# Patient Record
Sex: Female | Born: 1935 | Race: White | Hispanic: No | Marital: Married | Smoking: Never smoker
Health system: Southern US, Community
[De-identification: ages and names within clinical notes are randomized; demographics above are authoritative.]

## PROBLEM LIST (undated history)

## (undated) DIAGNOSIS — M810 Age-related osteoporosis without current pathological fracture: Secondary | ICD-10-CM

## (undated) DIAGNOSIS — N6019 Diffuse cystic mastopathy of unspecified breast: Secondary | ICD-10-CM

## (undated) DIAGNOSIS — H547 Unspecified visual loss: Secondary | ICD-10-CM

## (undated) DIAGNOSIS — Z803 Family history of malignant neoplasm of breast: Secondary | ICD-10-CM

## (undated) DIAGNOSIS — M1990 Unspecified osteoarthritis, unspecified site: Secondary | ICD-10-CM

## (undated) DIAGNOSIS — R0600 Dyspnea, unspecified: Secondary | ICD-10-CM

## (undated) DIAGNOSIS — Z1211 Encounter for screening for malignant neoplasm of colon: Secondary | ICD-10-CM

## (undated) DIAGNOSIS — I4891 Unspecified atrial fibrillation: Secondary | ICD-10-CM

## (undated) DIAGNOSIS — I509 Heart failure, unspecified: Secondary | ICD-10-CM

## (undated) DIAGNOSIS — I471 Supraventricular tachycardia, unspecified: Secondary | ICD-10-CM

## (undated) DIAGNOSIS — I10 Essential (primary) hypertension: Secondary | ICD-10-CM

## (undated) DIAGNOSIS — I219 Acute myocardial infarction, unspecified: Secondary | ICD-10-CM

## (undated) DIAGNOSIS — I517 Cardiomegaly: Secondary | ICD-10-CM

## (undated) DIAGNOSIS — IMO0002 Reserved for concepts with insufficient information to code with codable children: Secondary | ICD-10-CM

## (undated) DIAGNOSIS — E119 Type 2 diabetes mellitus without complications: Secondary | ICD-10-CM

## (undated) DIAGNOSIS — Z9889 Other specified postprocedural states: Secondary | ICD-10-CM

## (undated) DIAGNOSIS — E785 Hyperlipidemia, unspecified: Secondary | ICD-10-CM

## (undated) DIAGNOSIS — I2510 Atherosclerotic heart disease of native coronary artery without angina pectoris: Secondary | ICD-10-CM

## (undated) DIAGNOSIS — E079 Disorder of thyroid, unspecified: Secondary | ICD-10-CM

## (undated) HISTORY — DX: Type 2 diabetes mellitus without complications: E11.9

## (undated) HISTORY — DX: Encounter for screening for malignant neoplasm of colon: Z12.11

## (undated) HISTORY — DX: Heart failure, unspecified: I50.9

## (undated) HISTORY — DX: Family history of malignant neoplasm of breast: Z80.3

## (undated) HISTORY — DX: Dyspnea, unspecified: R06.00

## (undated) HISTORY — DX: Essential (primary) hypertension: I10

## (undated) HISTORY — DX: Disorder of thyroid, unspecified: E07.9

## (undated) HISTORY — DX: Unspecified visual loss: H54.7

## (undated) HISTORY — PX: BREAST SURGERY: SHX581

## (undated) HISTORY — DX: Atherosclerotic heart disease of native coronary artery without angina pectoris: I25.10

## (undated) HISTORY — DX: Diffuse cystic mastopathy of unspecified breast: N60.19

## (undated) HISTORY — DX: Acute myocardial infarction, unspecified: I21.9

## (undated) HISTORY — DX: Cardiomegaly: I51.7

## (undated) HISTORY — DX: Unspecified atrial fibrillation: I48.91

## (undated) HISTORY — DX: Supraventricular tachycardia, unspecified: I47.10

## (undated) HISTORY — PX: APPENDECTOMY: SHX54

## (undated) HISTORY — DX: Unspecified osteoarthritis, unspecified site: M19.90

## (undated) HISTORY — DX: Hyperlipidemia, unspecified: E78.5

## (undated) HISTORY — DX: Other specified postprocedural states: Z98.890

## (undated) HISTORY — PX: ABDOMINAL HYSTERECTOMY: SHX81

## (undated) HISTORY — DX: Reserved for concepts with insufficient information to code with codable children: IMO0002

## (undated) HISTORY — DX: Age-related osteoporosis without current pathological fracture: M81.0

## (undated) HISTORY — DX: Supraventricular tachycardia: I47.1

---

## 2003-03-08 DIAGNOSIS — H547 Unspecified visual loss: Secondary | ICD-10-CM

## 2003-03-08 DIAGNOSIS — I10 Essential (primary) hypertension: Secondary | ICD-10-CM

## 2003-03-08 DIAGNOSIS — IMO0002 Reserved for concepts with insufficient information to code with codable children: Secondary | ICD-10-CM

## 2003-03-08 HISTORY — DX: Essential (primary) hypertension: I10

## 2003-03-08 HISTORY — DX: Unspecified visual loss: H54.7

## 2003-03-08 HISTORY — DX: Reserved for concepts with insufficient information to code with codable children: IMO0002

## 2004-02-25 ENCOUNTER — Emergency Department: Payer: Self-pay | Admitting: Emergency Medicine

## 2004-03-07 DIAGNOSIS — I219 Acute myocardial infarction, unspecified: Secondary | ICD-10-CM

## 2004-03-07 HISTORY — DX: Acute myocardial infarction, unspecified: I21.9

## 2004-03-07 HISTORY — PX: CORONARY ARTERY BYPASS GRAFT: SHX141

## 2004-04-15 ENCOUNTER — Ambulatory Visit: Payer: Self-pay | Admitting: General Surgery

## 2004-09-14 ENCOUNTER — Encounter: Payer: Self-pay | Admitting: Cardiovascular Disease

## 2004-10-05 ENCOUNTER — Encounter: Payer: Self-pay | Admitting: Cardiovascular Disease

## 2004-11-05 ENCOUNTER — Encounter: Payer: Self-pay | Admitting: Cardiovascular Disease

## 2004-12-05 ENCOUNTER — Encounter: Payer: Self-pay | Admitting: Cardiovascular Disease

## 2005-01-05 ENCOUNTER — Encounter: Payer: Self-pay | Admitting: Cardiovascular Disease

## 2005-03-07 DIAGNOSIS — E119 Type 2 diabetes mellitus without complications: Secondary | ICD-10-CM

## 2005-03-07 HISTORY — DX: Type 2 diabetes mellitus without complications: E11.9

## 2005-06-29 ENCOUNTER — Ambulatory Visit: Payer: Self-pay | Admitting: General Surgery

## 2006-07-11 ENCOUNTER — Ambulatory Visit: Payer: Self-pay | Admitting: General Surgery

## 2007-03-08 DIAGNOSIS — Z9889 Other specified postprocedural states: Secondary | ICD-10-CM

## 2007-03-08 DIAGNOSIS — I517 Cardiomegaly: Secondary | ICD-10-CM

## 2007-03-08 HISTORY — DX: Cardiomegaly: I51.7

## 2007-03-08 HISTORY — DX: Other specified postprocedural states: Z98.890

## 2007-11-06 ENCOUNTER — Ambulatory Visit: Payer: Self-pay | Admitting: General Surgery

## 2008-03-07 HISTORY — PX: COLONOSCOPY: SHX174

## 2008-10-26 ENCOUNTER — Ambulatory Visit: Payer: Self-pay | Admitting: Internal Medicine

## 2008-11-07 ENCOUNTER — Ambulatory Visit: Payer: Self-pay | Admitting: General Surgery

## 2009-03-07 DIAGNOSIS — E079 Disorder of thyroid, unspecified: Secondary | ICD-10-CM

## 2009-03-07 HISTORY — DX: Disorder of thyroid, unspecified: E07.9

## 2009-12-09 ENCOUNTER — Ambulatory Visit: Payer: Self-pay | Admitting: General Surgery

## 2010-12-13 ENCOUNTER — Ambulatory Visit: Payer: Self-pay | Admitting: General Surgery

## 2011-03-08 DIAGNOSIS — M810 Age-related osteoporosis without current pathological fracture: Secondary | ICD-10-CM

## 2011-03-08 HISTORY — DX: Age-related osteoporosis without current pathological fracture: M81.0

## 2011-06-14 ENCOUNTER — Ambulatory Visit: Payer: Self-pay | Admitting: Internal Medicine

## 2011-06-14 LAB — URINALYSIS, COMPLETE
Bilirubin,UR: NEGATIVE
Blood: NEGATIVE
Glucose,UR: NEGATIVE mg/dL (ref 0–75)
Ketone: NEGATIVE
Nitrite: NEGATIVE
Ph: 6 (ref 4.5–8.0)
RBC,UR: NONE SEEN /HPF (ref 0–5)

## 2011-06-16 LAB — URINE CULTURE

## 2011-08-12 ENCOUNTER — Emergency Department: Payer: Self-pay | Admitting: Emergency Medicine

## 2011-10-17 ENCOUNTER — Ambulatory Visit: Payer: Self-pay | Admitting: Internal Medicine

## 2011-12-27 ENCOUNTER — Ambulatory Visit: Payer: Self-pay | Admitting: General Surgery

## 2012-08-13 ENCOUNTER — Encounter: Payer: Self-pay | Admitting: *Deleted

## 2012-08-13 DIAGNOSIS — I4891 Unspecified atrial fibrillation: Secondary | ICD-10-CM | POA: Insufficient documentation

## 2012-11-06 ENCOUNTER — Inpatient Hospital Stay: Payer: Self-pay | Admitting: Internal Medicine

## 2012-11-06 LAB — BASIC METABOLIC PANEL
Anion Gap: 4 — ABNORMAL LOW (ref 7–16)
BUN: 18 mg/dL (ref 7–18)
Chloride: 103 mmol/L (ref 98–107)
Co2: 32 mmol/L (ref 21–32)
EGFR (Non-African Amer.): 41 — ABNORMAL LOW
Osmolality: 287 (ref 275–301)
Sodium: 139 mmol/L (ref 136–145)

## 2012-11-06 LAB — CBC
HCT: 36.7 % (ref 35.0–47.0)
HGB: 12.3 g/dL (ref 12.0–16.0)
MCH: 30.5 pg (ref 26.0–34.0)
MCV: 91 fL (ref 80–100)
RBC: 4.05 10*6/uL (ref 3.80–5.20)
RDW: 13.3 % (ref 11.5–14.5)

## 2012-11-06 LAB — URINALYSIS, COMPLETE
Blood: NEGATIVE
Glucose,UR: NEGATIVE mg/dL (ref 0–75)
Ketone: NEGATIVE
Nitrite: NEGATIVE
Protein: NEGATIVE

## 2012-11-07 DIAGNOSIS — I369 Nonrheumatic tricuspid valve disorder, unspecified: Secondary | ICD-10-CM

## 2012-11-07 LAB — CBC WITH DIFFERENTIAL/PLATELET
Eosinophil %: 3.7 %
Lymphocyte #: 1.5 10*3/uL (ref 1.0–3.6)
Lymphocyte %: 17.2 %
MCHC: 34.1 g/dL (ref 32.0–36.0)
Monocyte %: 11.7 %
Neutrophil #: 5.8 10*3/uL (ref 1.4–6.5)
Platelet: 261 10*3/uL (ref 150–440)
RBC: 3.87 10*6/uL (ref 3.80–5.20)
RDW: 13.4 % (ref 11.5–14.5)
WBC: 8.8 10*3/uL (ref 3.6–11.0)

## 2012-11-07 LAB — MAGNESIUM: Magnesium: 1.8 mg/dL

## 2012-11-07 LAB — BASIC METABOLIC PANEL
BUN: 15 mg/dL (ref 7–18)
Calcium, Total: 8.8 mg/dL (ref 8.5–10.1)
Chloride: 106 mmol/L (ref 98–107)
Co2: 32 mmol/L (ref 21–32)
Creatinine: 1.14 mg/dL (ref 0.60–1.30)
EGFR (African American): 52 — ABNORMAL LOW
Potassium: 3.5 mmol/L (ref 3.5–5.1)

## 2012-11-07 LAB — LIPID PANEL
Cholesterol: 109 mg/dL (ref 0–200)
Ldl Cholesterol, Calc: 54 mg/dL (ref 0–100)
Triglycerides: 50 mg/dL (ref 0–200)

## 2012-12-27 ENCOUNTER — Ambulatory Visit: Payer: Self-pay | Admitting: General Surgery

## 2012-12-28 ENCOUNTER — Encounter: Payer: Self-pay | Admitting: General Surgery

## 2013-01-08 ENCOUNTER — Ambulatory Visit (INDEPENDENT_AMBULATORY_CARE_PROVIDER_SITE_OTHER): Payer: Medicare Other | Admitting: General Surgery

## 2013-01-08 ENCOUNTER — Encounter: Payer: Self-pay | Admitting: General Surgery

## 2013-01-08 VITALS — BP 98/50 | HR 78 | Resp 16 | Ht 63.0 in | Wt 123.0 lb

## 2013-01-08 DIAGNOSIS — Z1239 Encounter for other screening for malignant neoplasm of breast: Secondary | ICD-10-CM

## 2013-01-08 DIAGNOSIS — Z803 Family history of malignant neoplasm of breast: Secondary | ICD-10-CM | POA: Insufficient documentation

## 2013-01-08 NOTE — Progress Notes (Signed)
Patient ID: Kayla Harvey, female   DOB: 12/04/1929, 79 y.o.   MRN: [id number]  Chief Complaint  Patient presents with  . Follow-up    1 year screening mammogram    HPI Kayla Harvey is a 79 y.o. female who presents for a breast evaluation. The most recent mammogram was done on 12/27/12 with a birad category 1. Patient does perform regular self breast checks and gets regular mammograms done.  The patient denies any new problems with her breasts at this time.  She has had prior breast biopsies.   HPI  Past Medical History  Diagnosis Date  . Hypertension 2005  . Osteoporosis 2013  . SVT (supraventricular tachycardia)   . Ulcer 2005    foot  . Diffuse cystic mastopathy   . Thyroid disease 2011  . Arthritis   . Atrial fibrillation   . H/O cardiac catheterization 2009  . Enlarged heart 2009  . Special screening for malignant neoplasms, colon   . Diabetes mellitus without complication 2007    insulin dependent  . Myocardial infarction 2006    x2  . Hyperlipidemia   . Blindness 2005  . Family history of malignant neoplasm of breast     Oldest sister at age 48  . CHF (congestive heart failure)   . Coronary artery disease   . Dyspnea     Past Surgical History  Procedure Laterality Date  . Colonoscopy  2010    at [facility]  . Breast surgery Right     biopsy  . Coronary artery bypass graft  2006  . Appendectomy    . Abdominal hysterectomy      Family History  Problem Relation Age of Onset  . Cancer Sister 23    breast    Social History History  Substance Use Topics  . Smoking status: Never Smoker   . Smokeless tobacco: Not on file  . Alcohol Use: No    Allergies  Allergen Reactions  . Actos [Pioglitazone] Other (See Comments)    Mental changes  . Amlodipine   . Augmentin [Amoxicillin-Pot Clavulanate]   . Avelox [Moxifloxacin Hcl In Nacl]   . Diabenese [Chlorpropamide] Other (See Comments)    Liver problems  . Ivp Dye [Iodinated Diagnostic Agents]   . Macrodantin  [Nitrofurantoin Macrocrystal] Other (See Comments)    unknown  . Penicillins   . Sulfa Antibiotics     Current Outpatient Prescriptions  Medication Sig Dispense Refill  . aspirin 81 MG tablet Take 81 mg by mouth daily.      Marland Kitchen atorvastatin (LIPITOR) 40 MG tablet Take 1 tablet by mouth daily.      . carvedilol (COREG) 6.25 MG tablet 1 tab by mouth in AM 1/2 tab by mouth in PM.      . Cholecalciferol (VITAMIN D-3) 1000 UNITS CAPS Take 1 capsule by mouth daily.      Marland Kitchen FERROUS FUMARATE PO Take 1 tablet by mouth daily.      . furosemide (LASIX) 40 MG tablet Take 1 tablet by mouth daily.      Marland Kitchen glucagon 1 MG injection Inject 1 mg into the vein once as needed.      Marland Kitchen HUMALOG 100 UNIT/ML injection Per sliding scale.      . insulin glargine (LANTUS) 100 UNIT/ML injection 10 units in AM 8 units in PM subcutaneously.      Marland Kitchen loperamide (IMODIUM) 2 MG capsule Take 2 mg by mouth as needed for diarrhea or loose stools.      Marland Kitchen  losartan (COZAAR) 50 MG tablet Take 1 tablet by mouth daily.      . nitroGLYCERIN (NITROSTAT) 0.4 MG SL tablet Place 0.4 mg under the tongue every 5 (five) minutes as needed for chest pain.      Marland Kitchen PARoxetine (PAXIL) 10 MG tablet Take 1 tablet by mouth daily.      Marland Kitchen spironolactone (ALDACTONE) 25 MG tablet Take 1 tablet by mouth daily.      [name] 15 MG TABS tablet Take 1 tablet by mouth daily.       No current facility-administered medications for this visit.    Review of Systems Review of Systems  Constitutional: Negative.   Respiratory: Negative.   Cardiovascular: Negative.     Blood pressure 98/50, pulse 78, resp. rate 16, height 5\' 3"  (1.6 m), weight 123 lb (55.792 kg).  Physical Exam Physical Exam  Constitutional: She is oriented to person, place, and time. She appears well-developed and well-nourished.  Eyes: Conjunctivae are normal. No scleral icterus.  Neck: No thyromegaly present.  Cardiovascular: Normal rate, regular rhythm and normal heart sounds.   No  murmur heard. Pulmonary/Chest: Effort normal and breath sounds normal. Right breast exhibits no inverted nipple, no mass, no nipple discharge, no skin change and no tenderness. Left breast exhibits no inverted nipple, no mass, no nipple discharge, no skin change and no tenderness.  Somewhat of irregular and fibrotic feeling of both breasts but no defined masses.   Lymphadenopathy:    She has no cervical adenopathy.    She has no axillary adenopathy.  Neurological: She is alert and oriented to person, place, and time.  Skin: Skin is warm and dry.    Data Reviewed Mammogram reviewed.   Assessment    Stable exam.     Plan   Patient to return in 1 year with bilateral screening mammogram.         Dallin Mccorkel G 01/08/2013, 10:26 AM

## 2013-01-08 NOTE — Patient Instructions (Signed)
Patient to return in  1 year with bilateral screening mammogram. Patient to continue self breast exams. Also advised to contact our office with any new questions or concerns.

## 2013-10-24 ENCOUNTER — Emergency Department: Payer: Self-pay | Admitting: Emergency Medicine

## 2013-10-24 LAB — URINALYSIS, COMPLETE
BILIRUBIN, UR: NEGATIVE
Blood: NEGATIVE
Hyaline Cast: 2
Ketone: NEGATIVE
LEUKOCYTE ESTERASE: NEGATIVE
NITRITE: NEGATIVE
Ph: 5 (ref 4.5–8.0)
Protein: NEGATIVE
RBC, UR: NONE SEEN /HPF (ref 0–5)
Specific Gravity: 1.009 (ref 1.003–1.030)
Squamous Epithelial: <1
WBC UR: 3 /HPF (ref 0–5)

## 2013-10-24 LAB — CBC
HCT: 34 % — AB (ref 35.0–47.0)
HGB: 10.8 g/dL — ABNORMAL LOW (ref 12.0–16.0)
MCH: 29.4 pg (ref 26.0–34.0)
MCHC: 31.9 g/dL — ABNORMAL LOW (ref 32.0–36.0)
MCV: 92 fL (ref 80–100)
Platelet: 199 10*3/uL (ref 150–440)
RBC: 3.68 10*6/uL — AB (ref 3.80–5.20)
RDW: 13.1 % (ref 11.5–14.5)
WBC: 8 10*3/uL (ref 3.6–11.0)

## 2013-10-24 LAB — COMPREHENSIVE METABOLIC PANEL
ANION GAP: 9 (ref 7–16)
Albumin: 3.6 g/dL (ref 3.4–5.0)
Alkaline Phosphatase: 129 U/L — ABNORMAL HIGH
BUN: 21 mg/dL — AB (ref 7–18)
Bilirubin,Total: 0.6 mg/dL (ref 0.2–1.0)
CREATININE: 1.32 mg/dL — AB (ref 0.60–1.30)
Calcium, Total: 8.9 mg/dL (ref 8.5–10.1)
Chloride: 105 mmol/L (ref 98–107)
Co2: 28 mmol/L (ref 21–32)
EGFR (African American): 43 — ABNORMAL LOW
EGFR (Non-African Amer.): 37 — ABNORMAL LOW
Glucose: 418 mg/dL — ABNORMAL HIGH (ref 65–99)
OSMOLALITY: 304 (ref 275–301)
Potassium: 4 mmol/L (ref 3.5–5.1)
SGOT(AST): 33 U/L (ref 15–37)
SGPT (ALT): 27 U/L
SODIUM: 142 mmol/L (ref 136–145)
Total Protein: 6.4 g/dL (ref 6.4–8.2)

## 2014-01-05 DEATH — deceased

## 2014-01-06 ENCOUNTER — Encounter: Payer: Self-pay | Admitting: General Surgery

## 2014-01-09 ENCOUNTER — Ambulatory Visit: Payer: Medicare Other | Admitting: General Surgery

## 2014-06-27 NOTE — H&P (Signed)
PATIENT NAME:  Kayla Harvey, Kayla Harvey MR#:  [id number] DATE OF BIRTH:  07-23-1929  DATE OF ADMISSION:  11/06/2012  PRIMARY CARE PHYSICIAN: At [facility]. The patient does not remember the name.   CARDIOLOGIST: Dr. Lupita Shutter at [facility].  CHIEF COMPLAINT: Shortness of breath and pain underneath the breast.   HISTORY OF PRESENT ILLNESS: This is an 81 year old female who is a poor historian secondary to memory loss. She presented with shortness of breath, feeling that she cannot get enough air. She has been unable to sleep at night. Occasional cough, cannot walk without getting really short of breath. In the ER, she was found to have a pulse oximetry of 89% on room air, and hospitalist services were contacted for further evaluation.   PAST MEDICAL HISTORY: Coronary artery disease, osteoporosis, brittle diabetes, Charcot's foot, memory problems, atrial fibrillation, chronic kidney disease, right eye hemorrhage, hyperlipidemia, anxiety.   PAST SURGICAL HISTORY: CABG.   ALLERGIES: AVELOX.   MEDICATIONS: Include: Lantus 10 units in the a.m. and 8 units in the p.m., Humalog sliding scale, Xarelto 15 mg at bedtime, Coreg 12.5 mg b.i.d., Lasix recently switched to daily dose, losartan recently decreased to 50 mg daily, Paxil 10 mg daily, Lipitor 40 mg daily and aspirin 81 mg daily.   SOCIAL HISTORY: No smoking. No alcohol. No drug use. Used to work at [employer] and [employer] in the past.   FAMILY HISTORY: Mother died at 29 of a stomach ulcer. Father died at 61 of myocardial infarction.   REVIEW OF SYSTEMS:  CONSTITUTIONAL: Positive for sweats at night. Weight gain 3-1/2 pounds. Positive for fatigue.  EYES: Does wear glasses.  EARS, NOSE, MOUTH, AND THROAT: No hearing loss. No sore throat. No difficulty swallowing.  CARDIOVASCULAR: Pain under her breast.  RESPIRATORY: Positive for shortness of breath. Occasional cough. No sputum. No hemoptysis.  GASTROINTESTINAL: Occasional diarrhea. No nausea. No vomiting. No  abdominal pain. No bright red blood per rectum. No melena.  GENITOURINARY: No burning on urination or hematuria.  MUSCULOSKELETAL: No joint pain or muscle pain.  INTEGUMENT: No rashes or eruptions.  NEUROLOGIC: No fainting or blackouts.  PSYCHIATRIC: Positive for anxiety.  ENDOCRINE: No thyroid problems.  HEMATOLOGIC/LYMPHATIC: No anemia.   PHYSICAL EXAMINATION: VITAL SIGNS: Pulse oximetry documented 89% as per ER physician, blood pressure 179/104, temperature 98.4, pulse 77, respirations 20, pulse oximetry with oxygen now at 96% on 2 liters.  GENERAL: No respiratory distress.  EYES: Conjunctivae and lids normal. Pupils equal, round, and reactive to light. Extraocular muscles intact. No nystagmus.  EARS, NOSE, MOUTH AND THROAT: Tympanic membranes: No erythema. Nasal mucosa: No erythema. Throat:  No erythema. No exudate seen. Lips and gums: No lesions.  NECK: No JVD. No bruits. No lymphadenopathy. No thyromegaly. No thyroid nodules palpated.  RESPIRATORY: Lungs decreased breath sounds bilateral bases. Positive rales at bilateral bases. No use of accessory muscles to breathe. No wheeze or rhonchi heard.  CARDIOVASCULAR: S1, S2, irregular, irregular, +2/6 systolic ejection murmur. Carotid upstroke 2+ bilaterally. No bruits.  EXTREMITIES: Dorsalis pedis pulses 2+ bilaterally. 2+ edema of the lower extremity.  ABDOMEN: Soft, nontender. No organomegaly/splenomegaly. Normoactive bowel sounds. No masses felt.  LYMPHATIC: No lymph nodes in the neck.  MUSCULOSKELETAL: No clubbing, 2+ edema. No cyanosis.  SKIN: No ulcers or lesions seen. Scars of prior surgical site. No infection seen.   PSYCHIATRIC: The patient is alert and oriented to person and place.  NEUROLOGIC: Cranial nerves II through XII grossly intact. Deep tendon reflexes 2+ bilateral lower extremity.  LABORATORY AND RADIOLOGICAL DATA: BNP 3332. Troponin negative. White blood cell count 9.6, hemoglobin and hematocrit 12.3 and 36.7,  platelet count 281. Glucose 230, BUN 18, creatinine 1.23, sodium 139, potassium 3.5, chloride 103, CO2 of 32, calcium 9.3. Urinalysis 2+ leukocyte esterase.   Chest x-ray CABG with mild cardiomegaly, bilateral small moderate pleural effusions. Old healed posterior left lateral rib fracture present   EKG: Atrial fibrillation, 60 beats per minute, poor R wave progression.   ASSESSMENT AND PLAN: 1.  Acute respiratory failure with pulse oximetry 89% on room air. We will give oxygen supplementation, likely secondary to congestive heart failure and pleural effusions.  2.  Acute congestive heart failure with pleural effusions. We will give Lasix 40 mg IV b.i.d.,  continue losartan and Coreg, get an echocardiogram to evaluate ejection fraction.  3.  Atrial fibrillation. Rate controlled on Coreg and anticoagulated with Xarelto.   4.  Brittle diabetes. Continue Lantus and Humalog.  5.  Chronic kidney disease. Watch with diuresis.  6.  Malignant hypertension. Blood pressure elevated. Restart higher dose losartan and continue to monitor closely on medications.  7.  Hyperlipidemia. Continue Lipitor.  8.  Anxiety and memory loss on Paxil. This seems to be really distressing to the patient.  9.  Chest pain. I believe this is probably scar tissue from previous surgery, but we will check cardiac enzymes.   TIME SPENT ON ADMISSION: 55 minutes    ____________________________ Herschell Dimesichard J. Renae GlossWieting, MD rjw:cc D: 11/06/2012 22:32:10 ET T: 11/06/2012 22:52:15 ET JOB#: [id number]  cc: Herschell Dimesichard J. Renae GlossWieting, MD, <Dictator> Francesco RunnerMichael A. Blazing, MD, [facility] Salley ScarletICHARD J Karinne Schmader MD ELECTRONICALLY SIGNED 11/10/2012 14:23

## 2014-06-27 NOTE — Discharge Summary (Signed)
PATIENT NAME:  [name], Kayla J MR#:  [id number] DATE OF BIRTH:  [birth year]  DATE OF ADMISSION:  11/06/2012 DATE OF DISCHARGE:  11/08/2012  DISCHARGE DIAGNOSES: 1.  Acute on chronic systolic heart failure.  2.  Hypertension.  3.  Diabetes mellitus, type 2, brittle diabetes mellitus.  4. History of coronary artery disease with coronary artery bypass graft, and suspected cerebrovascular accident ruled out.    DISCHARGE DIAGNOSES: 1.  Acute on chronic systolic heart failure. 2.  Hypertension. 3.  Diabetes mellitus, type 2, brittle diabetes mellitus. 4. History of coronary artery disease with coronary artery bypass graft, and suspected cerebrovascular accident ruled out.   5.  Atrial fibrillation.   DISCHARGE MEDICATIONS:  Include Xarelto 15 mg daily, aspirin 81 mg daily, Imodium as needed, Paxil 10 mg p.o. daily, atorvastatin 40 mg p.o. daily, Nitrostat sublingual as needed for chest pain, losartan 50 mg p.o. daily, furosemide 40 mg p.o. b.i.d.; the patient takes 40 mg daily, but recently she used to take 80 mg p.o. b.i.d. but recently cut to 40 [facility], Coreg 12.5 mg p.o. b.i.d.   CONSULTATIONS: None.   HOSPITAL COURSE: An 81 year old female patient admitted for shortness of breath and the patient had some cough and pedal edema. Oxygen saturation 89% on room air. The patient's BNP on admission showed 3,332. The white count was normal. Kidney function was normal. Admitted, started on IV Lasix 40 mg IV b.i.d. and the patient's chest x-ray concerning for pulmonary edema. She had a good improvement with urine output and swelling improved,  shortness of breath improved and O2 sats were 93% on room air at the time of discharge. The patient's symptoms resolved. She wanted to go on and follow up [facility].  1.  Diabetes mellitus type 2. The patient does have brittle diabetes with fluctuating sugar levels. The patient takes Lantus 10 units in the morning, 8  units at night and Humalog with sliding scale, so advised to continue that.  2.  History of chronic atrial fibrillation. She is on Xarelto and Coreg. Advised to continue the Xarelto and Coreg.  3.  The patient has chronic systolic heart failure. She is on Coreg and losartan. 4.  History of hyperlipidemia. She is on Lipitor.   The patient had an echocardiogram done here, which showed eection fraction of 35 to 40% and  global LV dysfunction with diffuse wall hypokinesia. (  ____________________________ Katha HammingSnehalatha Khrystyna Schwalm, MD sk:nts D: 11/09/2012 22:50:13 ET T: 11/10/2012 02:16:31 ET JOB#: [id number]  cc: Katha HammingSnehalatha Alohilani Levenhagen, MD, <Dictator> Katha HammingSNEHALATHA Edye Hainline MD ELECTRONICALLY SIGNED 11/22/2012 22:29
